# Patient Record
Sex: Female | Born: 1985 | Race: White | Hispanic: No | Marital: Single
Health system: Southern US, Community
[De-identification: ages and names within clinical notes are randomized; demographics above are authoritative.]

## PROBLEM LIST (undated history)

## (undated) DIAGNOSIS — F988 Other specified behavioral and emotional disorders with onset usually occurring in childhood and adolescence: Secondary | ICD-10-CM

## (undated) DIAGNOSIS — F419 Anxiety disorder, unspecified: Secondary | ICD-10-CM

## (undated) DIAGNOSIS — F32A Depression, unspecified: Secondary | ICD-10-CM

## (undated) DIAGNOSIS — G2581 Restless legs syndrome: Secondary | ICD-10-CM

---

## 2020-10-31 ENCOUNTER — Other Ambulatory Visit: Payer: Self-pay | Admitting: Student

## 2020-10-31 DIAGNOSIS — N643 Galactorrhea not associated with childbirth: Secondary | ICD-10-CM

## 2020-10-31 DIAGNOSIS — E041 Nontoxic single thyroid nodule: Secondary | ICD-10-CM

## 2020-11-03 ENCOUNTER — Other Ambulatory Visit: Payer: Self-pay

## 2020-11-03 ENCOUNTER — Ambulatory Visit
Admission: RE | Admit: 2020-11-03 | Discharge: 2020-11-03 | Disposition: A | Discharge status: Home / Self Care | Payer: Commercial Managed Care - PPO | Source: Ambulatory Visit | Attending: Student | Admitting: Student

## 2020-11-03 DIAGNOSIS — E041 Nontoxic single thyroid nodule: Secondary | ICD-10-CM | POA: Insufficient documentation

## 2020-11-03 DIAGNOSIS — N643 Galactorrhea not associated with childbirth: Secondary | ICD-10-CM | POA: Insufficient documentation

## 2021-09-27 IMAGING — US US THYROID
1 series · 14 of 25 positions shown · non-contrast
Comparison: None.

CLINICAL DATA: Other. 34-year-old female with galactorrhea
syndrome, concern for thyroid cysts.

EXAM:
THYROID ULTRASOUND
TECHNIQUE: Ultrasound examination of the thyroid gland and adjacent soft
tissues was performed.

[Series 1: us thyroid · 0.07mm/px · 14 of 45 slices shown]
[im 1/45]
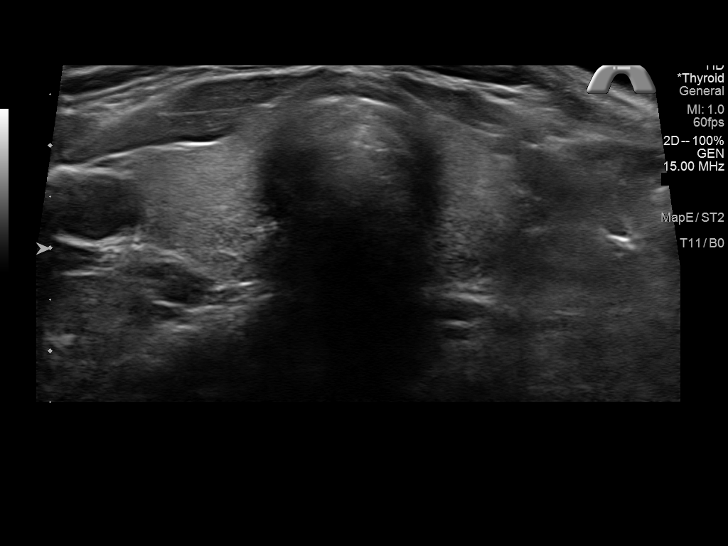
[im 4/45]
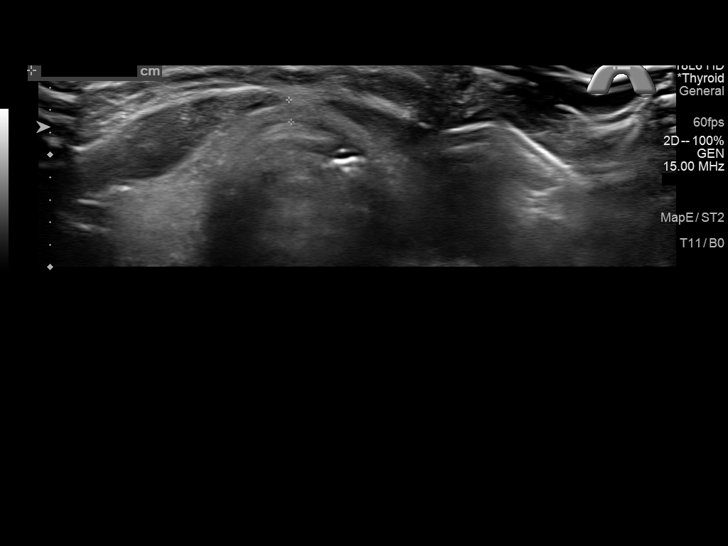
[im 8/45]
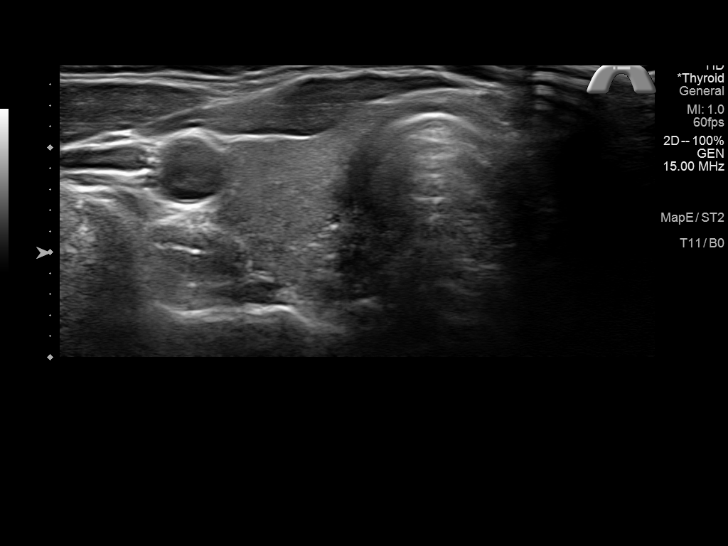
[im 12/45]
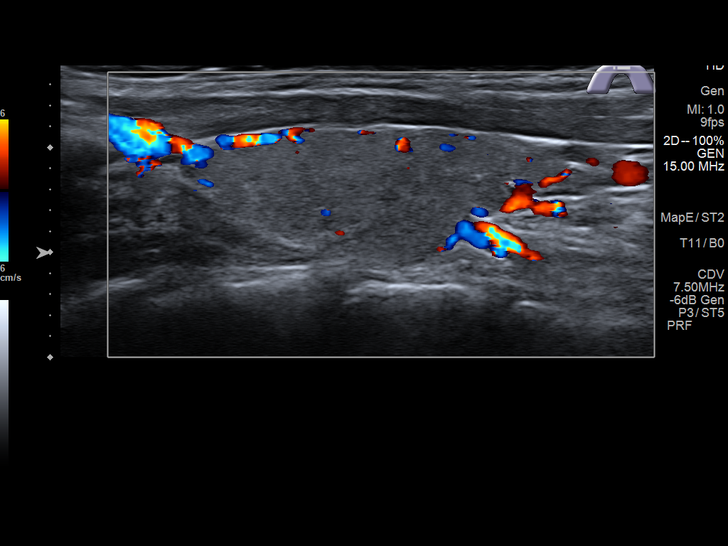
[im 15/45]
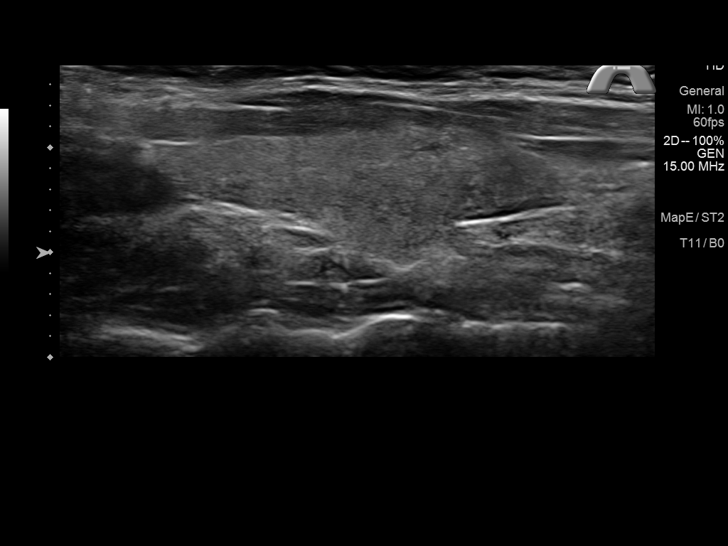
[im 17/45]
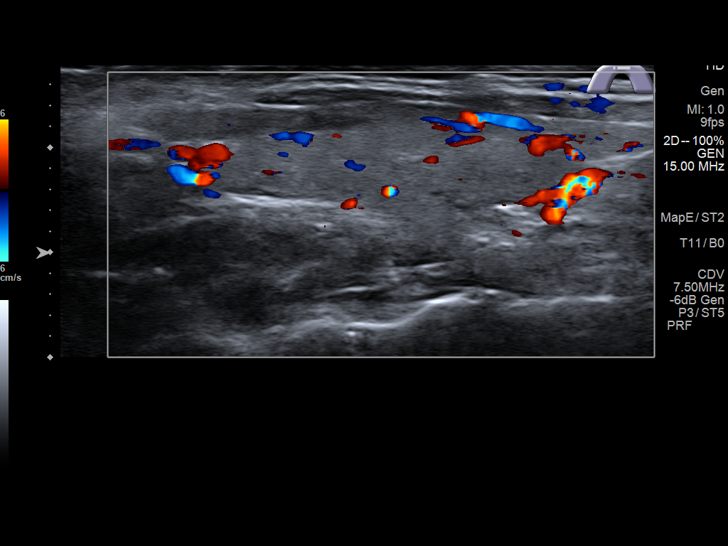
[im 21/45]
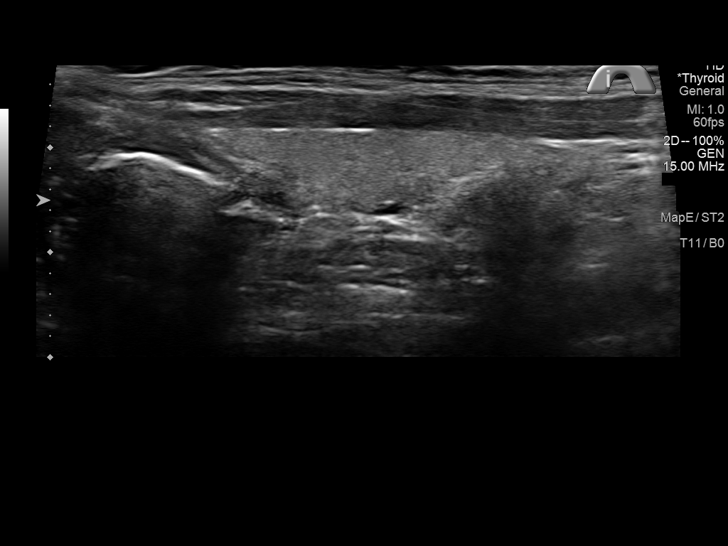
[im 24/45]
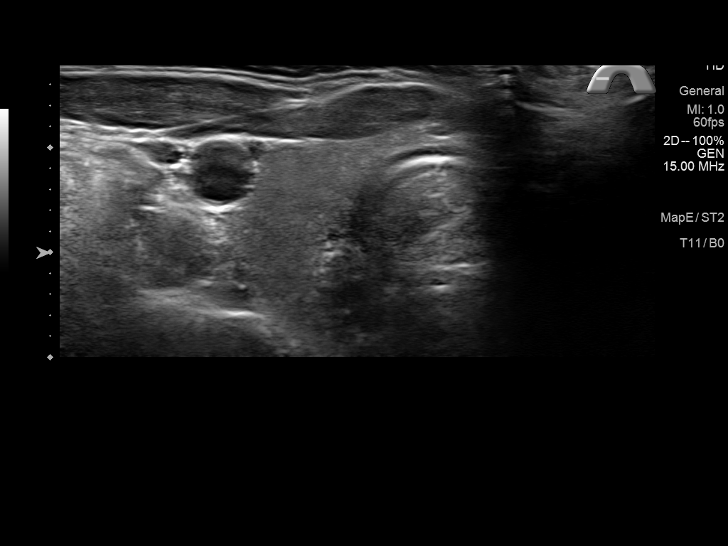
[im 28/45]
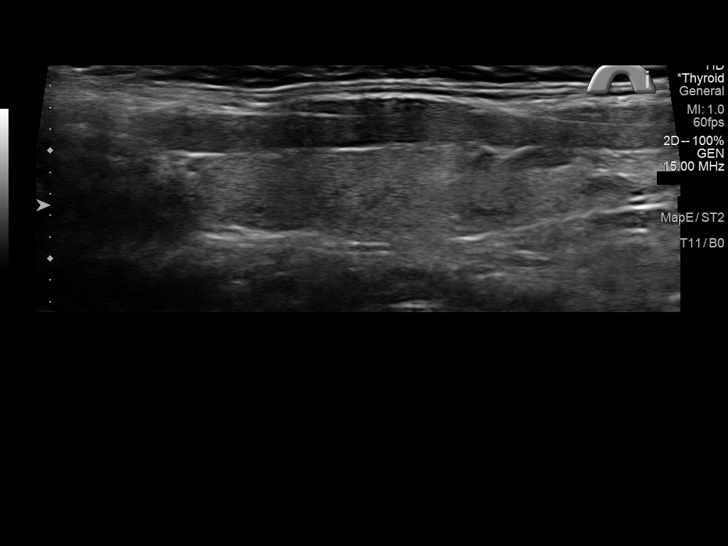
[im 30/45]
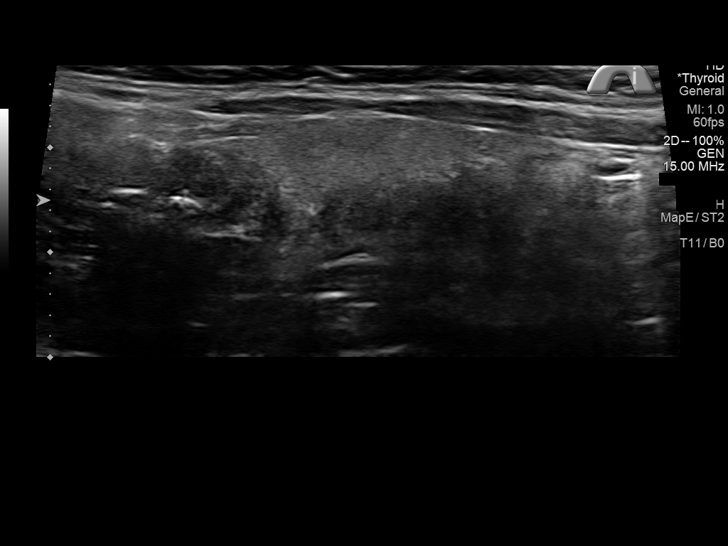
[im 34/45]
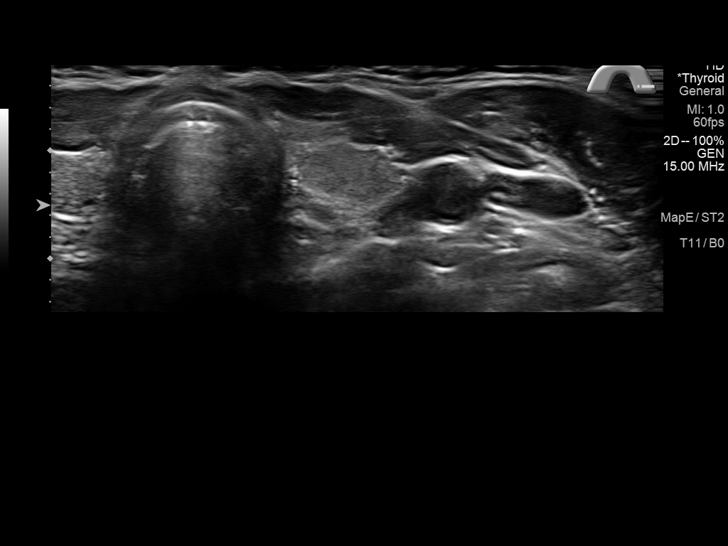
[im 37/45]
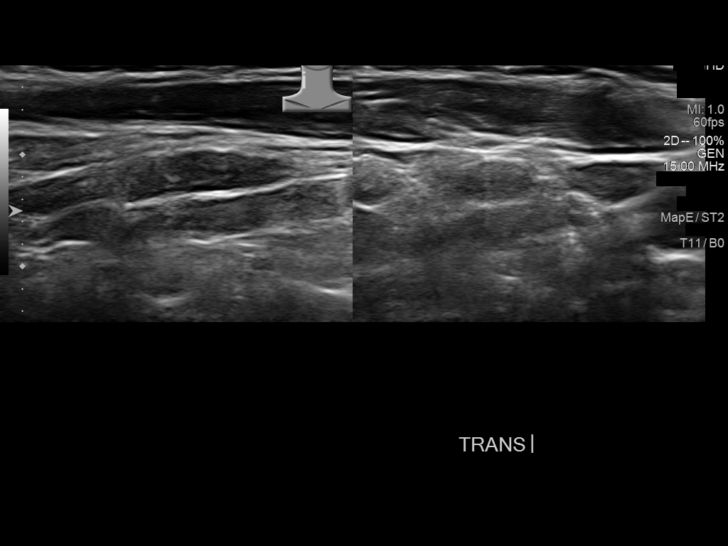
[im 41/45]
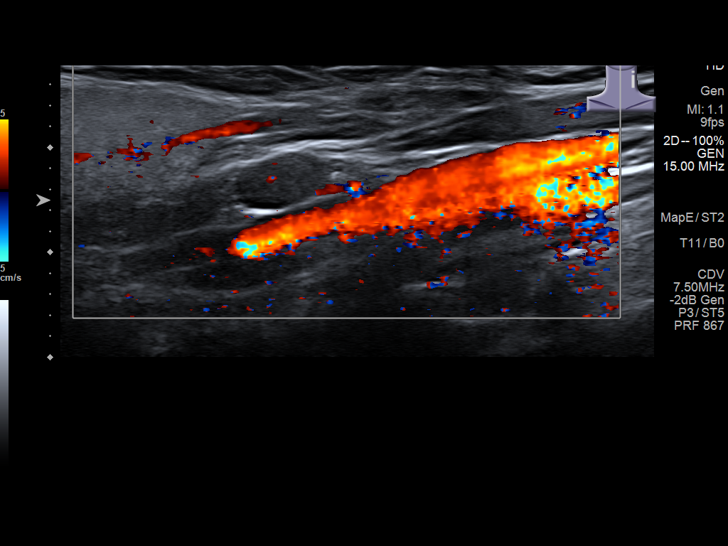
[im 45/45]
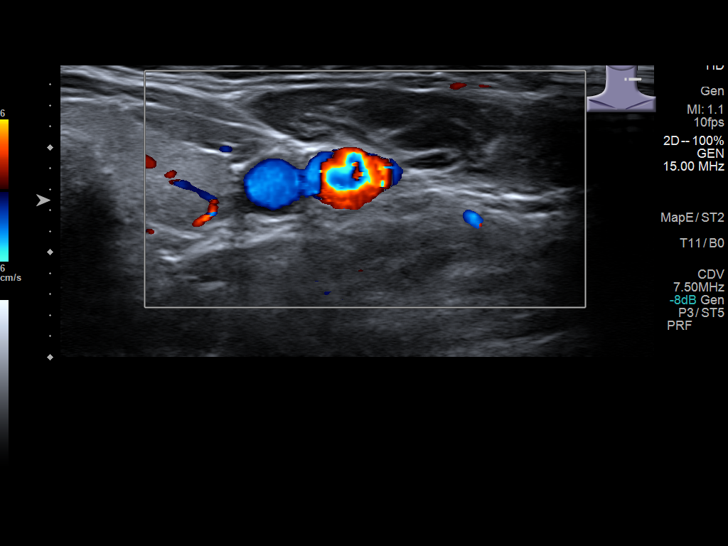

[14 of 25 positions shown; findings below may reference images not displayed]

FINDINGS: Parenchymal Echotexture: Normal

Isthmus: 0.2 cm

Right lobe: 3.7 x 1.4 x 1.2 cm

Left lobe: 3.9 x 1.3 x 1.0 cm

_________________________________________________________

Estimated total number of nodules >/= 1 cm: 0

Number of spongiform nodules >/=  2 cm not described below (TR1): 0

Number of mixed cystic and solid nodules >/= 1.5 cm not described
below (TR2): 0

_________________________________________________________

No discrete nodules are seen within the thyroid gland.
IMPRESSION: Normal sonographic appearance the thyroid gland. No discrete nodules
are identified.

## 2021-11-18 ENCOUNTER — Emergency Department: Payer: Commercial Managed Care - PPO

## 2021-11-18 ENCOUNTER — Emergency Department
Admission: EM | Admit: 2021-11-18 | Discharge: 2021-11-18 | Disposition: A | Discharge status: Home / Self Care | Payer: Commercial Managed Care - PPO | Attending: Emergency Medicine | Admitting: Emergency Medicine

## 2021-11-18 DIAGNOSIS — R42 Dizziness and giddiness: Secondary | ICD-10-CM | POA: Diagnosis not present

## 2021-11-18 DIAGNOSIS — R002 Palpitations: Secondary | ICD-10-CM | POA: Diagnosis not present

## 2021-11-18 HISTORY — DX: Restless legs syndrome: G25.81

## 2021-11-18 HISTORY — DX: Anxiety disorder, unspecified: F41.9

## 2021-11-18 HISTORY — DX: Depression, unspecified: F32.A

## 2021-11-18 HISTORY — DX: Other specified behavioral and emotional disorders with onset usually occurring in childhood and adolescence: F98.8

## 2021-11-18 LAB — CBC WITH DIFFERENTIAL/PLATELET
Abs Immature Granulocytes: 0.02 10*3/uL (ref 0.00–0.07)
Basophils Absolute: 0 10*3/uL (ref 0.0–0.1)
Basophils Relative: 0 %
Eosinophils Absolute: 0.1 10*3/uL (ref 0.0–0.5)
Eosinophils Relative: 1 %
HCT: 37.9 % (ref 36.0–46.0)
Hemoglobin: 12.6 g/dL (ref 12.0–15.0)
Immature Granulocytes: 0 %
Lymphocytes Relative: 32 %
Lymphs Abs: 2.4 10*3/uL (ref 0.7–4.0)
MCH: 30.8 pg (ref 26.0–34.0)
MCHC: 33.2 g/dL (ref 30.0–36.0)
MCV: 92.7 fL (ref 80.0–100.0)
Monocytes Absolute: 0.5 10*3/uL (ref 0.1–1.0)
Monocytes Relative: 7 %
Neutro Abs: 4.3 10*3/uL (ref 1.7–7.7)
Neutrophils Relative %: 60 %
Platelets: 356 10*3/uL (ref 150–400)
RBC: 4.09 MIL/uL (ref 3.87–5.11)
RDW: 11.9 % (ref 11.5–15.5)
WBC: 7.4 10*3/uL (ref 4.0–10.5)
nRBC: 0 % (ref 0.0–0.2)

## 2021-11-18 LAB — TROPONIN I (HIGH SENSITIVITY)
Troponin I (High Sensitivity): <2 ng/L (ref <18)
Troponin I (High Sensitivity): <2 ng/L (ref <18)

## 2021-11-18 LAB — COMPREHENSIVE METABOLIC PANEL
ALT: 17 U/L (ref 0–44)
AST: 20 U/L (ref 15–41)
Albumin: 3.3 g/dL — ABNORMAL LOW (ref 3.5–5.0)
Alkaline Phosphatase: 39 U/L (ref 38–126)
Anion gap: 10 (ref 5–15)
BUN: 10 mg/dL (ref 6–20)
CO2: 23 mmol/L (ref 22–32)
Calcium: 8.9 mg/dL (ref 8.9–10.3)
Chloride: 104 mmol/L (ref 98–111)
Creatinine, Ser: 0.67 mg/dL (ref 0.44–1.00)
GFR, Estimated: >60 mL/min (ref >60)
Glucose, Bld: 87 mg/dL (ref 70–99)
Potassium: 3.5 mmol/L (ref 3.5–5.1)
Sodium: 137 mmol/L (ref 135–145)
Total Bilirubin: 0.4 mg/dL (ref 0.3–1.2)
Total Protein: 6.7 g/dL (ref 6.5–8.1)

## 2021-11-18 LAB — TSH: TSH: 2.434 u[IU]/mL (ref 0.350–4.500)

## 2021-11-18 MED ORDER — SODIUM CHLORIDE 0.9 % IV BOLUS
1000.0000 mL | Freq: Once | INTRAVENOUS | Status: AC
  Administered 2021-11-18: 1000 mL via INTRAVENOUS

## 2021-11-18 NOTE — ED Provider Notes (Addendum)
? ?[facility] ?Provider Note ? ? ? Event Date/Time  ? First MD Initiated Contact with Patient 11/18/21 1308   ?  (approximate) ? ? ?History  ? ?Palpitations ? ? ?HPI ? ?Stephanie Reed is a 36 y.o. female who reports a couple weeks of episodes of tachycardia.  Her doctor is scheduled to have a heart monitor to put on her on Friday.  She had several episodes today of lightheadedness and seem to have tachycardia when she had lightheadedness.  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  This was not brought with her however.  She has some pulse ox readings on her phone that show a rapid heart rate. ?She does drink caffeine every day but it is limited to 1 red bull a day.  She is not drinking a lot of water she says she does not do drugs and drinks possibly of beer a day.  Maybe 2 ?  ? ? ?Physical Exam  ? ?Triage Vital Signs: ?ED Triage Vitals  ?Enc Vitals Group  ?   BP 11/18/21 1315 116/87  ?   Pulse Rate 11/18/21 1315 (!) 104  ?   Resp 11/18/21 1315 20  ?   Temp 11/18/21 1315 98.9 ?F (37.2 ?C)  ?   Temp Source 11/18/21 1315 Oral  ?   SpO2 11/18/21 1315 99 %  ?   Weight 11/18/21 1317 120 lb (54.4 kg)  ?   Height 11/18/21 1317 5\' 1"  (1.549 m)  ?   Head Circumference --   ?   Peak Flow --   ?   Pain Score --   ?   Pain Loc --   ?   Pain Edu? --   ?   Excl. in GC? --   ? ? ?Most recent vital signs: ?Vitals:  ? 11/18/21 1400 11/18/21 1500  ?BP: 121/90 118/90  ?Pulse: 78 80  ?Resp: 15 19  ?Temp:    ?SpO2: 100% 99%  ? ? ? ?General: Awake, no distress.  ?CV:  Good peripheral perfusion.  Heart regular rate and rhythm no audible murmurs ?Resp:  Normal effort.  Lungs are clear ?Abd:  No distention.  Soft bowel sounds positive nontender ?Extremities no edema ? ? ?ED Results / Procedures / Treatments  ? ?Labs ?(all labs ordered are listed, but only abnormal results are displayed) ?Labs Reviewed  ?COMPREHENSIVE METABOLIC PANEL - Abnormal; Notable for the  following components:  ?    Result Value  ? Albumin 3.3 (*)   ? All other components within normal limits  ?CBC WITH DIFFERENTIAL/PLATELET  ?TSH  ?TROPONIN I (HIGH SENSITIVITY)  ?TROPONIN I (HIGH SENSITIVITY)  ? ? ? ?EKG ? ?EKG read and interpreted by me shows normal sinus rhythm at 81 normal axis essentially normal EKG ? ? ?RADIOLOGY ?Chest x-ray read by radiology I reviewed the films no acute disease was seen ? ? ?PROCEDURES: ? ?Critical Care performed: \ ? ?Procedures ? ? ?MEDICATIONS ORDERED IN ED: ?Medications  ?sodium chloride 0.9 % bolus 1,000 mL (1,000 mLs Intravenous New Bag/Given 11/18/21 1525)  ? ? ? ?IMPRESSION / MDM / ASSESSMENT AND PLAN / ED COURSE  ?I reviewed the triage vital signs and the nursing notes. ?Patient with history of palpitations.  By history she had an EKG showing sinus tach at a rate of 152.  I cannot see this EKG I do not have access to it.  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  I do not believe I can get that done any sooner here.  I will allow her to go.  Her troponins are negative her EKG and chest x-ray are normal everything I have checked is negative.  I do not have any evidence of SVT.  Patient is lightheaded when she stands up briefly and then this resolves.  She may be dehydrated, by her own history she has not drank much water today.  I will encourage her to drink more fluids that are noncaffeinated and follow-up with her doctor and get the heart monitor placed.  She will return if she has any further problems. ? ? ?The patient is on the cardiac monitor to evaluate for evidence of arrhythmia and/or significant heart rate changes.  None were seen. ? ?According to orthostatic-start p.o.-[employer] bookshelf orthostatic changes defined by fall and systolic blood pressure over 20 mmHg.  This did not happen.  Patient did have a pulse go up somewhat but came back down to normal after  less than a minute.  The higher dose was what was recorded. ? ? ?FINAL CLINICAL IMPRESSION(S) / ED DIAGNOSES  ? ?Final diagnoses:  ?Palpitations  ? ? ? ?Rx / DC Orders  ? ?ED Discharge Orders   ? ? None  ? ?  ? ? ? ?Note:  This document was prepared using Dragon voice recognition software and may include unintentional dictation errors. ?  ?Arnaldo Natal, MD ?11/18/21 1628 ? ?  ?Arnaldo Natal, MD ?11/18/21 1637 ? ?

## 2021-11-18 NOTE — ED Notes (Signed)
Pt provided discharge instructions and prescription information. Pt was given the opportunity to ask questions and questions were answered. Discharge signature not obtained in the setting of the COVID-19 pandemic in order to reduce high touch surfaces.  ° °

## 2021-11-18 NOTE — Discharge Instructions (Addendum)
Rest and take it easy for the next couple days.  Be sure to drink plenty of fluids that are not caffeinated.  Follow-up with your doctor ASAP.  Make sure you get the heart monitor placed Friday as scheduled.  Return for any further problems. ?

## 2021-11-18 NOTE — ED Triage Notes (Signed)
Pt reports they were at work sitting down and felt her heartrate get elevated, felt a "weird feeling" in her chest, pulse ox on phone read 152-157, work checked her pulse and stated it was around 120's. Pt reports this has been going on off and on for a while, has appt with cards for monitor.  ?

## 2022-10-12 IMAGING — DX DG CHEST 1V PORT
1 series · 1 of 1 positions shown · non-contrast
Comparison: None.

CLINICAL DATA: Palpitations.

EXAM:
PORTABLE CHEST 1 VIEW

[chest ap]
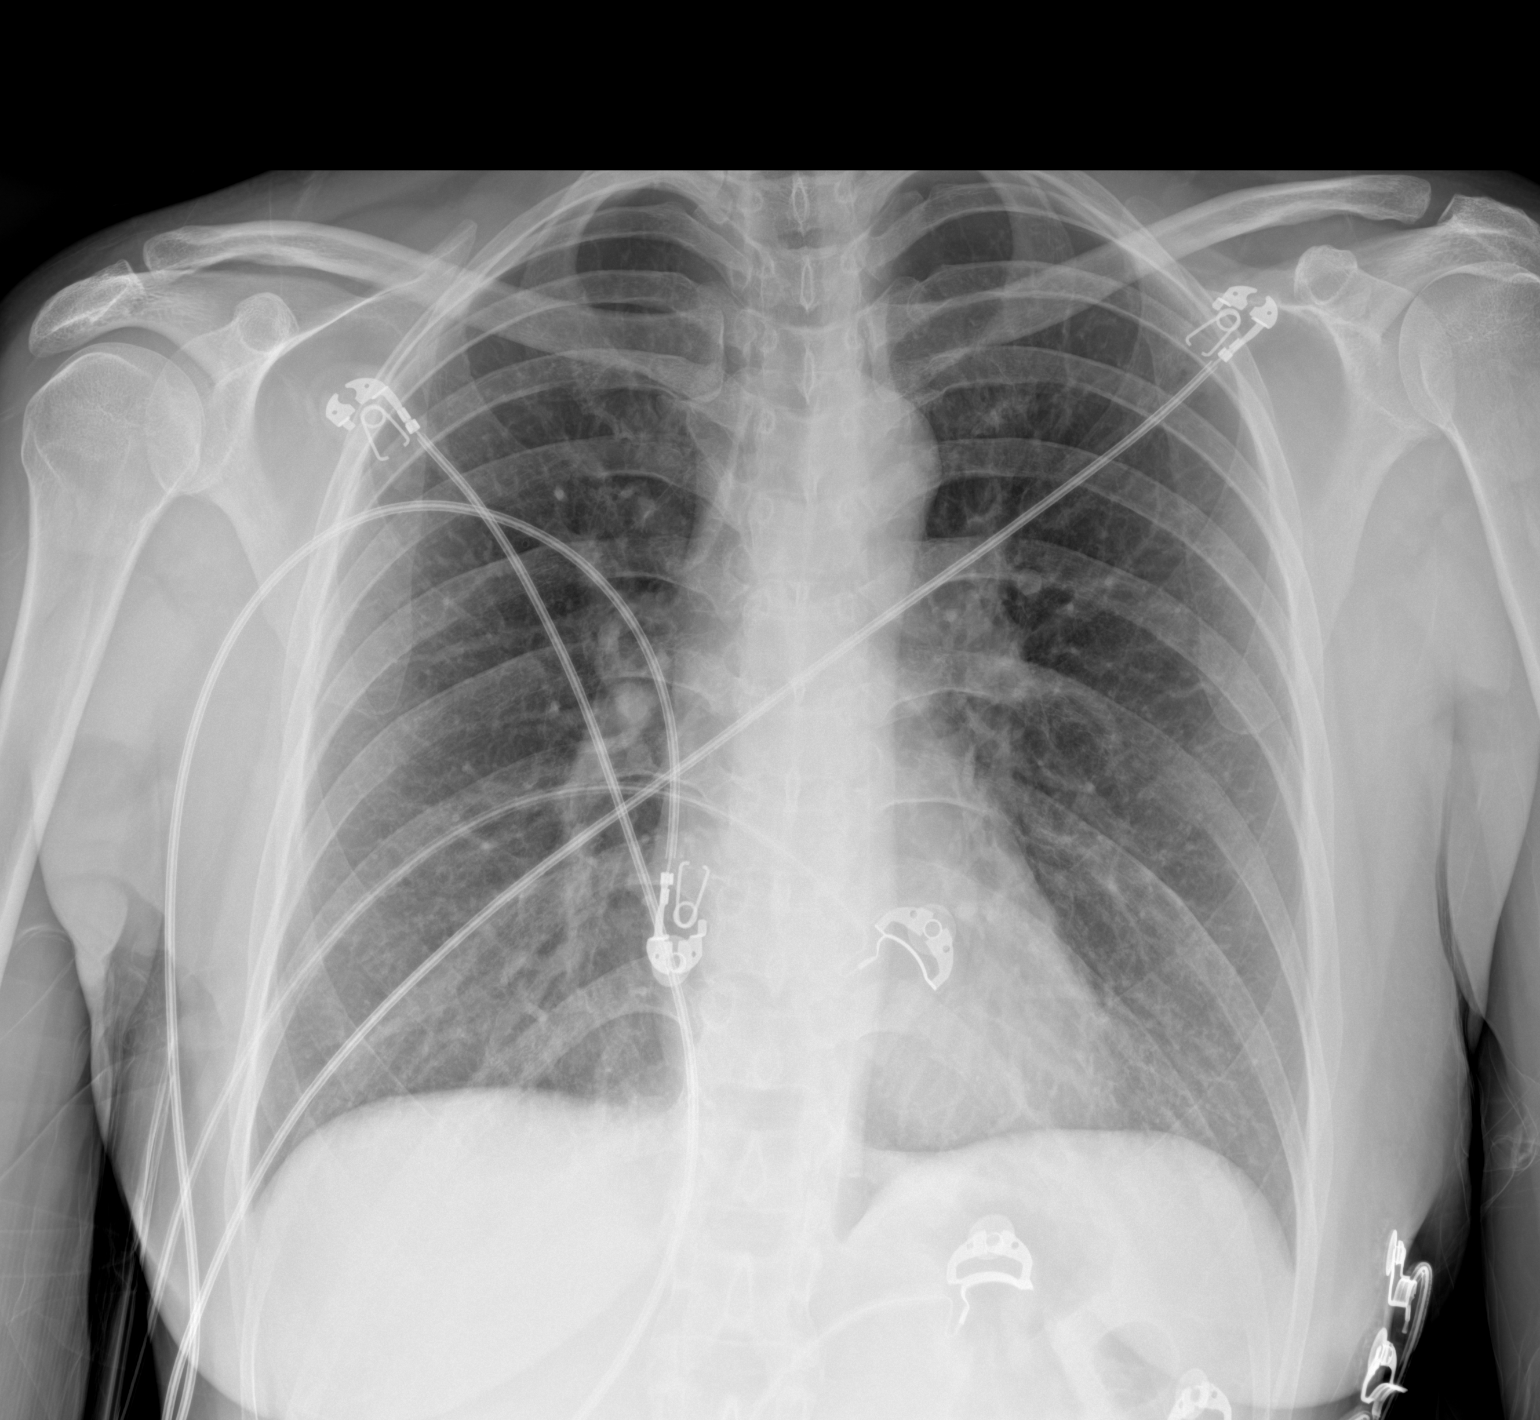

[1 of 1 positions shown; findings below may reference images not displayed]

FINDINGS: The heart size and mediastinal contours are within normal limits.
Both lungs are clear. The visualized skeletal structures are
unremarkable.
IMPRESSION: No active disease.
